# Patient Record
Sex: Male | Born: 2000 | Race: Black or African American | Hispanic: No | Marital: Single | Smoking: Never smoker
Health system: Southern US, Community
[De-identification: ages and names within clinical notes are randomized; demographics above are authoritative.]

## PROBLEM LIST (undated history)

## (undated) HISTORY — PX: WISDOM TOOTH EXTRACTION: SHX21

---
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
## 2022-08-10 IMAGING — CT CT HEAD W/O CM
4 series · 17 of 47 positions shown, 19 images · non-contrast
Comparison: None.

CLINICAL DATA: Fall with head trauma and loss of consciousness

EXAM:
CT HEAD WITHOUT CONTRAST
TECHNIQUE: Contiguous axial images were obtained from the base of the skull
through the vertex without intravenous contrast.

[Series 3: head bone · axial · 0.39mm/px · z∈[-105,-57]mm · 4 of 72 slices shown]
[im 8/72  bone]
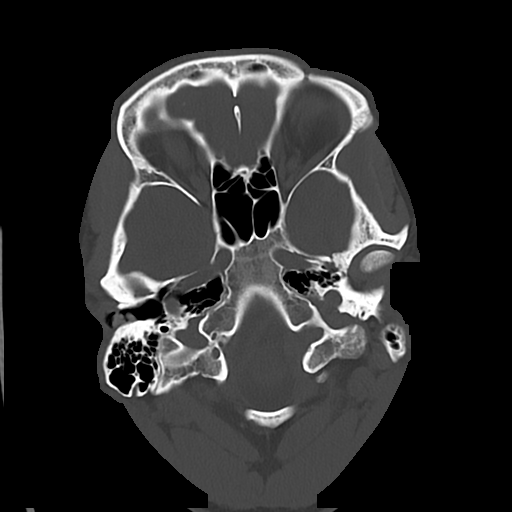
[im 15/72  bone]
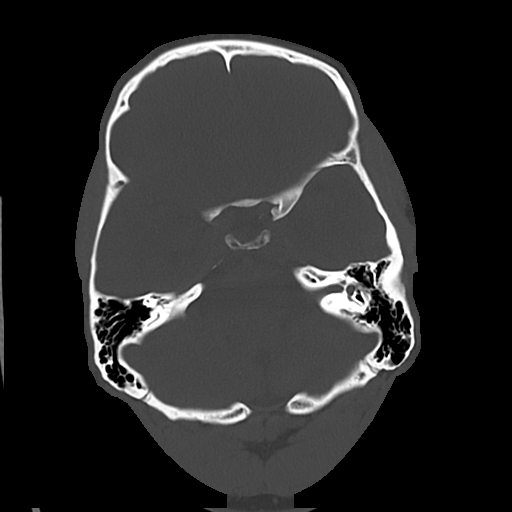
[im 22/72  bone]
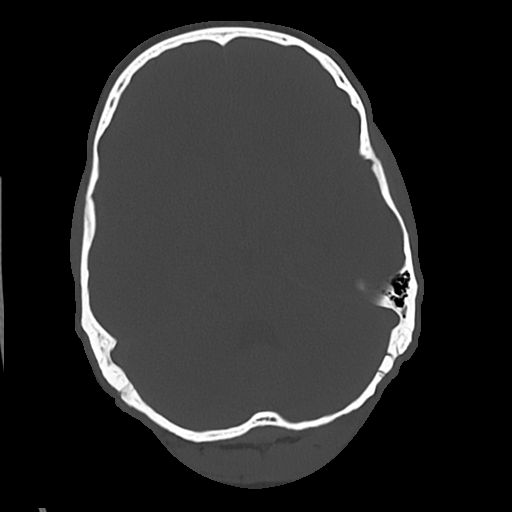
[im 32/72  bone]
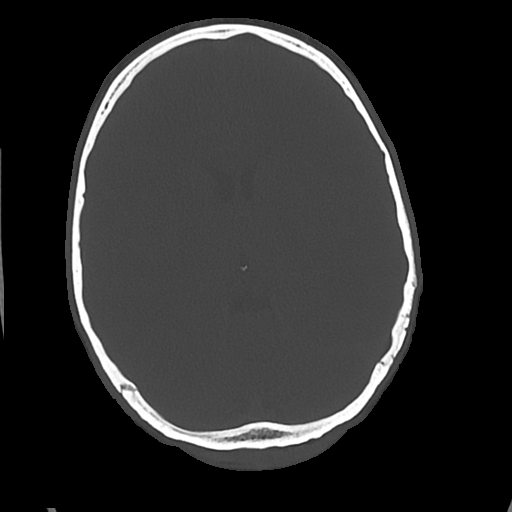

[Series 4: head wo · axial · 0.39mm/px · z∈[-104,+1]mm · 7 of 29 slices shown, 9 images]
[im 4/29  brain]
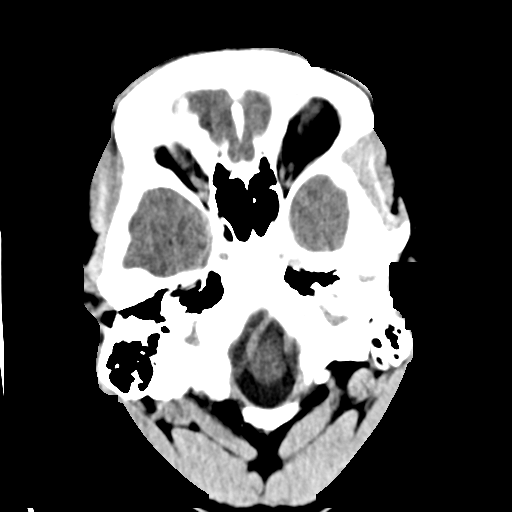
[im 4/29  bone]
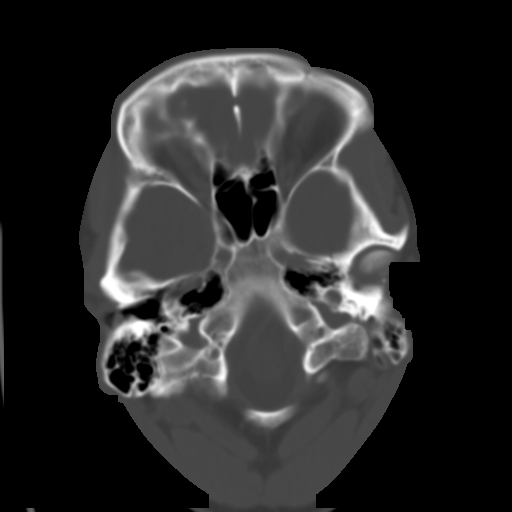
[im 8/29  brain]
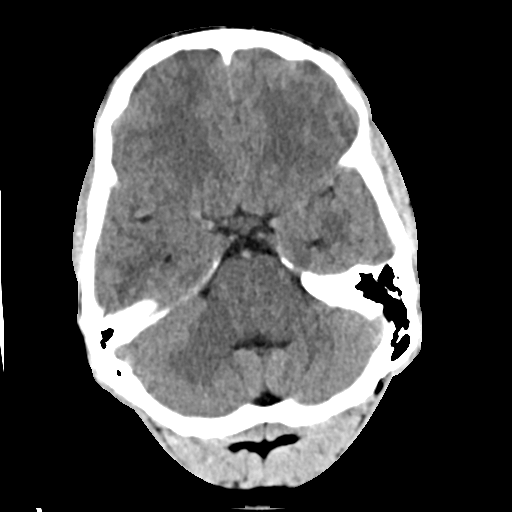
[im 11/29  brain]
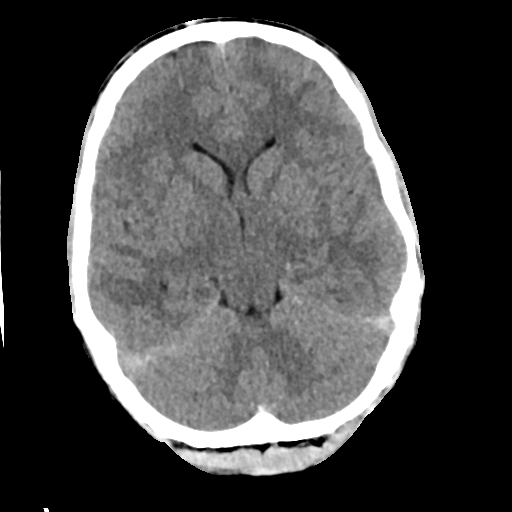
[im 15/29  brain]
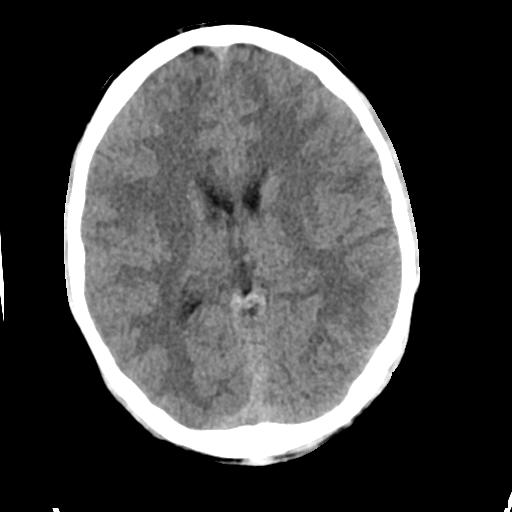
[im 18/29  brain]
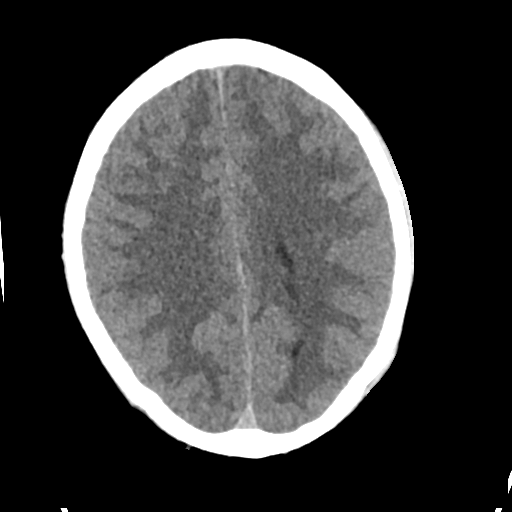
[im 18/29  bone]
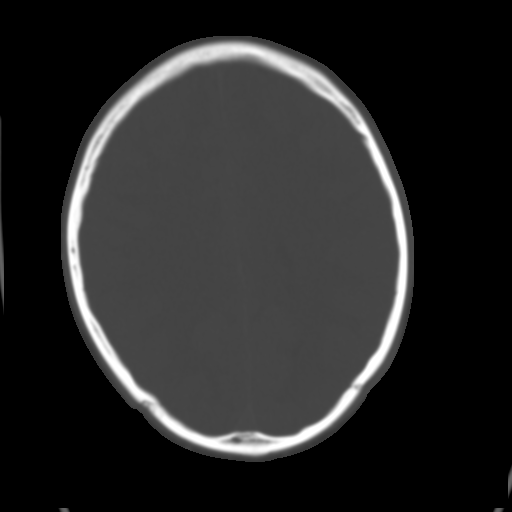
[im 22/29  brain]
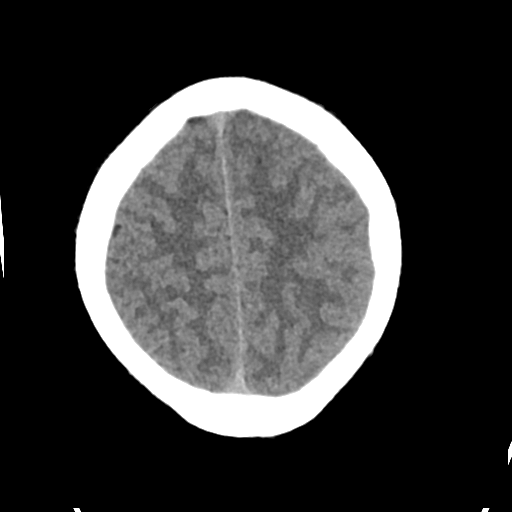
[im 25/29  brain]
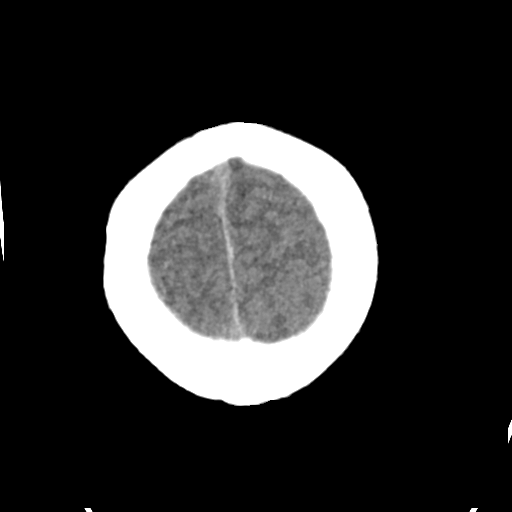

[Series 5: cor soft · coronal · 0.33mm/px · 3 of 64 slices shown]
[im 22/64  brain]
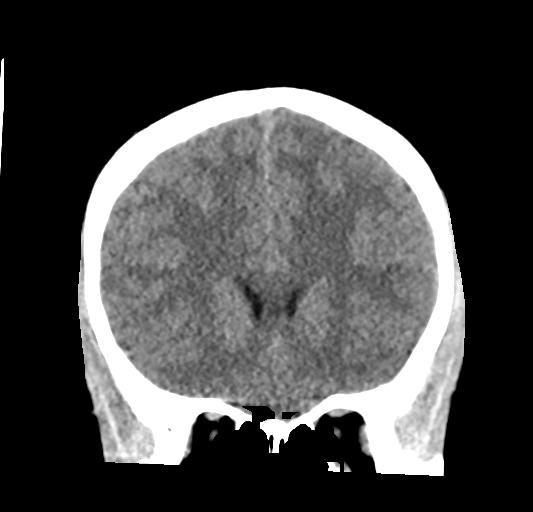
[im 29/64  brain]
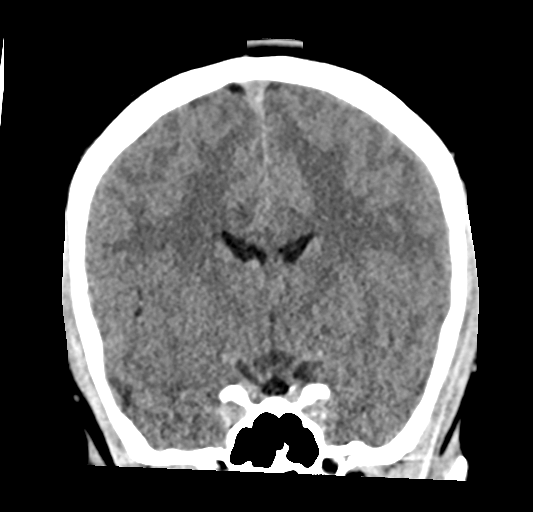
[im 36/64  brain]
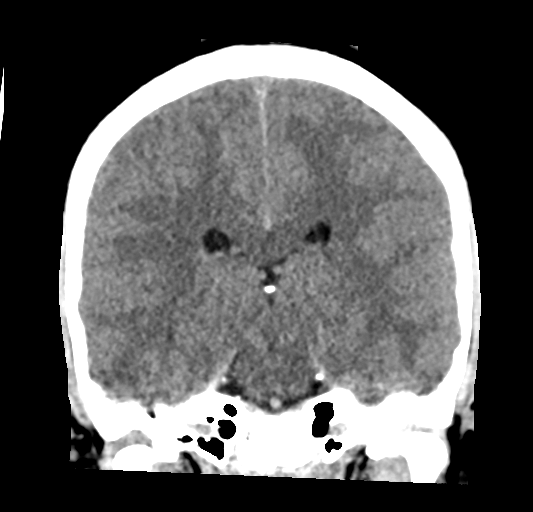

[Series 6: sag soft · sagittal · 0.33mm/px · 3 of 60 slices shown]
[im 20/60  brain]
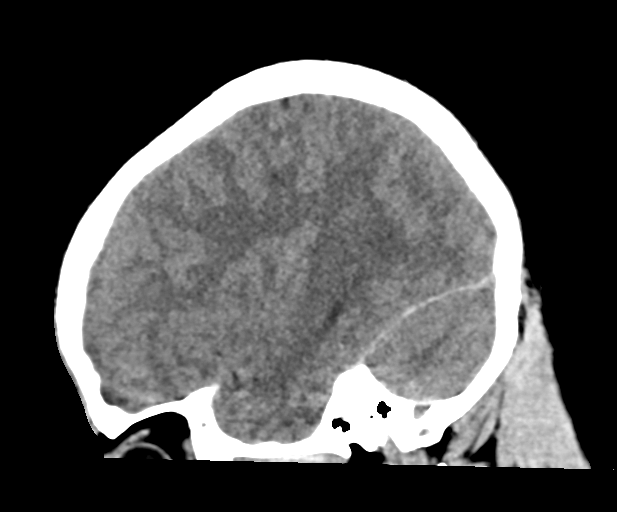
[im 30/60  brain]
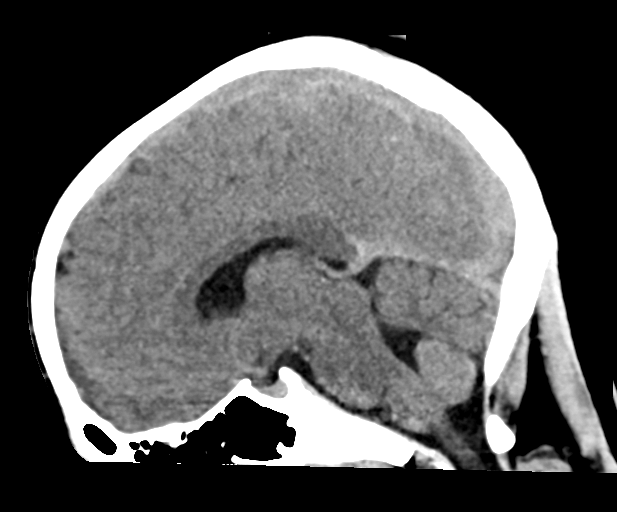
[im 40/60  brain]
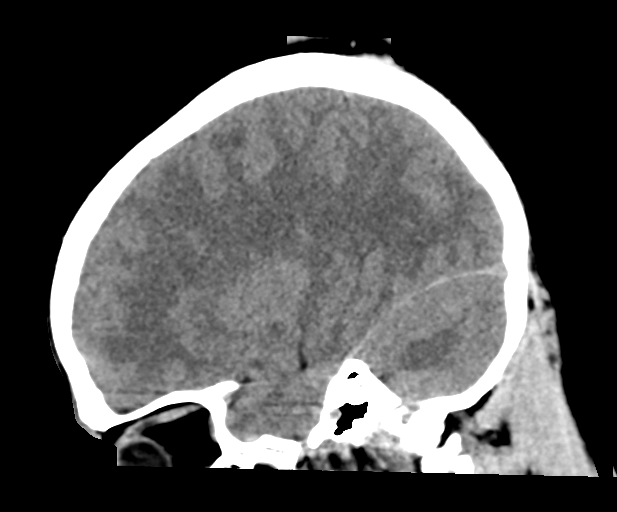

[17 of 47 positions shown; findings below may reference images not displayed]

FINDINGS: Brain: No evidence of acute infarction, hemorrhage, hydrocephalus,
extra-axial collection or mass lesion/mass effect.

Vascular: No hyperdense vessel or unexpected calcification.

Skull: Normal. Negative for fracture or focal lesion.

Sinuses/Orbits: No acute finding.

Other: None.
IMPRESSION: No acute intracranial abnormality.

## 2022-09-19 ENCOUNTER — Inpatient Hospital Stay (HOSPITAL_COMMUNITY)
Admission: EM | Admit: 2022-09-19 | Discharge: 2022-09-21 | DRG: 558 | Disposition: A | Discharge status: Home / Self Care | Payer: Medicaid Other | Attending: Family Medicine | Admitting: Family Medicine

## 2022-09-19 ENCOUNTER — Other Ambulatory Visit: Payer: Self-pay

## 2022-09-19 ENCOUNTER — Encounter (HOSPITAL_COMMUNITY): Payer: Self-pay

## 2022-09-19 DIAGNOSIS — D72829 Elevated white blood cell count, unspecified: Secondary | ICD-10-CM | POA: Insufficient documentation

## 2022-09-19 DIAGNOSIS — R7989 Other specified abnormal findings of blood chemistry: Secondary | ICD-10-CM | POA: Diagnosis not present

## 2022-09-19 DIAGNOSIS — E878 Other disorders of electrolyte and fluid balance, not elsewhere classified: Secondary | ICD-10-CM | POA: Diagnosis present

## 2022-09-19 DIAGNOSIS — E785 Hyperlipidemia, unspecified: Secondary | ICD-10-CM | POA: Diagnosis present

## 2022-09-19 DIAGNOSIS — R7401 Elevation of levels of liver transaminase levels: Secondary | ICD-10-CM | POA: Diagnosis not present

## 2022-09-19 DIAGNOSIS — N179 Acute kidney failure, unspecified: Secondary | ICD-10-CM | POA: Diagnosis present

## 2022-09-19 DIAGNOSIS — M6282 Rhabdomyolysis: Secondary | ICD-10-CM | POA: Diagnosis not present

## 2022-09-19 DIAGNOSIS — E871 Hypo-osmolality and hyponatremia: Secondary | ICD-10-CM | POA: Diagnosis present

## 2022-09-19 LAB — COMPREHENSIVE METABOLIC PANEL
ALT: 23 U/L (ref 0–44)
AST: 43 U/L — ABNORMAL HIGH (ref 15–41)
Albumin: 4.6 g/dL (ref 3.5–5.0)
Alkaline Phosphatase: 51 U/L (ref 38–126)
Anion gap: 15 (ref 5–15)
BUN: 14 mg/dL (ref 6–20)
CO2: 24 mmol/L (ref 22–32)
Calcium: 9.7 mg/dL (ref 8.9–10.3)
Chloride: 95 mmol/L — ABNORMAL LOW (ref 98–111)
Creatinine, Ser: 1.48 mg/dL — ABNORMAL HIGH (ref 0.61–1.24)
GFR, Estimated: >60 mL/min (ref >60)
Glucose, Bld: 88 mg/dL (ref 70–99)
Potassium: 4.2 mmol/L (ref 3.5–5.1)
Sodium: 134 mmol/L — ABNORMAL LOW (ref 135–145)
Total Bilirubin: 1.7 mg/dL — ABNORMAL HIGH (ref 0.3–1.2)
Total Protein: 6.9 g/dL (ref 6.5–8.1)

## 2022-09-19 LAB — CBC WITH DIFFERENTIAL/PLATELET
Abs Immature Granulocytes: 0.05 10*3/uL (ref 0.00–0.07)
Basophils Absolute: 0.1 10*3/uL (ref 0.0–0.1)
Basophils Relative: 0 %
Eosinophils Absolute: 0 10*3/uL (ref 0.0–0.5)
Eosinophils Relative: 0 %
HCT: 42.3 % (ref 39.0–52.0)
Hemoglobin: 15 g/dL (ref 13.0–17.0)
Immature Granulocytes: 0 %
Lymphocytes Relative: 9 %
Lymphs Abs: 1.2 10*3/uL (ref 0.7–4.0)
MCH: 29.8 pg (ref 26.0–34.0)
MCHC: 35.5 g/dL (ref 30.0–36.0)
MCV: 83.9 fL (ref 80.0–100.0)
Monocytes Absolute: 0.6 10*3/uL (ref 0.1–1.0)
Monocytes Relative: 5 %
Neutro Abs: 11.7 10*3/uL — ABNORMAL HIGH (ref 1.7–7.7)
Neutrophils Relative %: 86 %
Platelets: 214 10*3/uL (ref 150–400)
RBC: 5.04 MIL/uL (ref 4.22–5.81)
RDW: 12 % (ref 11.5–15.5)
WBC: 13.7 10*3/uL — ABNORMAL HIGH (ref 4.0–10.5)
nRBC: 0 % (ref 0.0–0.2)

## 2022-09-19 LAB — MAGNESIUM: Magnesium: 2.3 mg/dL (ref 1.7–2.4)

## 2022-09-19 LAB — CK: Total CK: 1213 U/L — ABNORMAL HIGH (ref 49–397)

## 2022-09-19 MED ORDER — ENOXAPARIN SODIUM 40 MG/0.4ML IJ SOSY
40.0000 mg | PREFILLED_SYRINGE | INTRAMUSCULAR | Status: DC
  Administered 2022-09-19 – 2022-09-20 (×2): 40 mg via SUBCUTANEOUS
  Filled 2022-09-19 (×2): qty 0.4

## 2022-09-19 MED ORDER — ACETAMINOPHEN 650 MG RE SUPP: 650.0000 mg | Freq: Four times a day (QID) | RECTAL | Status: DC | PRN

## 2022-09-19 MED ORDER — SODIUM CHLORIDE 0.9% FLUSH
3.0000 mL | Freq: Two times a day (BID) | INTRAVENOUS | Status: DC
  Administered 2022-09-19 – 2022-09-21 (×3): 3 mL via INTRAVENOUS

## 2022-09-19 MED ORDER — POLYETHYLENE GLYCOL 3350 17 G PO PACK: 17.0000 g | PACK | Freq: Every day | ORAL | Status: DC | PRN

## 2022-09-19 MED ORDER — SODIUM CHLORIDE 0.9 % IV BOLUS
1000.0000 mL | Freq: Once | INTRAVENOUS | Status: AC
  Administered 2022-09-19: 1000 mL via INTRAVENOUS

## 2022-09-19 MED ORDER — SODIUM CHLORIDE 0.9 % IV SOLN: INTRAVENOUS | Status: DC

## 2022-09-19 MED ORDER — ACETAMINOPHEN 325 MG PO TABS: 650.0000 mg | ORAL_TABLET | Freq: Four times a day (QID) | ORAL | Status: DC | PRN

## 2022-09-19 NOTE — H&P (Signed)
History and Physical   [name]:[id number] DOB: 2000/07/16 DOA: 09/19/2022  PCP: Ronney Asters, MD   Patient coming from: Home  Chief Complaint: Diffuse cramping/muscle aches  HPI: Jacob Decker is a 22 y.o. male with medical history significant of hyperlipidemia, childhood obesity presenting with diffuse muscle pain/cramping.  Patient reports he had football practice today which included a lot of running but no significant hitting drills.  He states that he felt well during but after he went to the locker room following practice he began to get cramping first in his left leg than his right leg.  This progressed to full body cramping.  Reports a single dark urination since practice.  Denies fevers, chills, chest pain, shortness of breath, abdominal pain, constipation, diarrhea, nausea, vomiting.  ED Course: Vital signs in the ED stable.  Lab workup included CMP with sodium 134, chloride 95, creatinine elevated to 1.48 from unknown baseline, AST 43, T. bili 1.7.  CBC with leukocytosis to 13.7.  CK elevated to 1213.  Magnesium normal.  Patient received 2 L IV fluid in the ED.  Review of Systems: As per HPI otherwise all other systems reviewed and are negative.  Past Medical History:  Diagnosis Date   Childhood obesity, BMI 95-100 percentile 12/20/2019    Past Surgical History:  Procedure Laterality Date   WISDOM TOOTH EXTRACTION      Social History  reports that he has never smoked. He has never used smokeless tobacco. He reports that he does not drink alcohol and does not use drugs.  No Known Allergies  History reviewed. No pertinent family history.  Prior to Admission medications   Not on File    Physical Exam: Vitals:   09/19/22 1522 09/19/22 1543  BP: 139/61   Pulse: 82   Resp: 16   Temp: 98 F (36.7 C)   TempSrc: Oral   SpO2: 100%   Weight:  79.4 kg  Height:  5\' 8"  (1.727 m)    Physical Exam Constitutional:      General: He is not in  acute distress.    Appearance: Normal appearance.  HENT:     Head: Normocephalic and atraumatic.     Mouth/Throat:     Mouth: Mucous membranes are moist.     Pharynx: Oropharynx is clear.  Eyes:     Extraocular Movements: Extraocular movements intact.     Pupils: Pupils are equal, round, and reactive to light.  Cardiovascular:     Rate and Rhythm: Normal rate and regular rhythm.     Pulses: Normal pulses.     Heart sounds: Normal heart sounds.  Pulmonary:     Effort: Pulmonary effort is normal. No respiratory distress.     Breath sounds: Normal breath sounds.  Abdominal:     General: Bowel sounds are normal. There is no distension.     Palpations: Abdomen is soft.     Tenderness: There is no abdominal tenderness.  Musculoskeletal:        General: No swelling or deformity.  Skin:    General: Skin is warm and dry.  Neurological:     General: No focal deficit present.     Mental Status: Mental status is at baseline.    Labs on Admission: I have personally reviewed following labs and imaging studies  CBC: Recent Labs  Lab 09/19/22 1613  WBC 13.7*  NEUTROABS 11.7*  HGB 15.0  HCT 42.3  MCV 83.9  PLT 214    Basic Metabolic Panel:  Recent Labs  Lab 09/19/22 1613  NA 134*  K 4.2  CL 95*  CO2 24  GLUCOSE 88  BUN 14  CREATININE 1.48*  CALCIUM 9.7  MG 2.3    GFR: Estimated Creatinine Clearance: 77 mL/min (A) (by C-G formula based on SCr of 1.48 mg/dL (H)).  Liver Function Tests: Recent Labs  Lab 09/19/22 1613  AST 43*  ALT 23  ALKPHOS 51  BILITOT 1.7*  PROT 6.9  ALBUMIN 4.6    Urine analysis: No results found for: "COLORURINE", "APPEARANCEUR", "LABSPEC", "PHURINE", "GLUCOSEU", "HGBUR", "BILIRUBINUR", "KETONESUR", "PROTEINUR", "UROBILINOGEN", "NITRITE", "LEUKOCYTESUR"  Radiological Exams on Admission: No results found.  EKG: Independently reviewed.  Not performed in the emergency department.  Assessment/Plan Active Problems:   Leukocytosis    Transaminitis   Elevated serum creatinine   Rhabdomyolysis   Rhabdomyolysis > Diffuse muscle cramping/aching after football practice today with minimal urination following. > Elevated CK at 1213.  Suspect this will likely arrive significantly before coming back down. > Received 2 L IV fluids in the ED. - Monitor on MedSurg unit - Continue with IV fluid, at least 200 cc an hour - Continue to trend CK - Follow-up urinalysis - Strict I's and O's  Leukocytosis > WBC 13.7 in ED, presumed reactive in the setting of above.  Transaminitis > Mild LFT elevations with AST 43 and T. bili 1.7 likely reactive in the setting of above.  Creatinine elevation > Creatinine elevated to 1.48 from unknown baseline.  Likely component of AKI in the setting of rhabdomyolysis however there could be component secondary to increased muscle mass. - Monitor response to IV fluids - Continue to trend renal function and electrolytes  DVT prophylaxis: Lovenox Code Status:   Full Family Communication:  None on admission  Disposition Plan:   Patient is from:  Home  Anticipated DC to:  Home  Anticipated DC date:  1 to 2 days  Anticipated DC barriers: None  Consults called:  None Admission status:  Observation, MedSurg  Severity of Illness: The appropriate patient status for this patient is OBSERVATION. Observation status is judged to be reasonable and necessary in order to provide the required intensity of service to ensure the patient's safety. The patient's presenting symptoms, physical exam findings, and initial radiographic and laboratory data in the context of their medical condition is felt to place them at decreased risk for further clinical deterioration. Furthermore, it is anticipated that the patient will be medically stable for discharge from the hospital within 2 midnights of admission.    Synetta Fail MD [facility]  How to contact the [facility] Attending or Consulting provider 7A - 7P or  covering provider during after hours 7P -7A, for this patient?   Check the care team in [facility] and look for a) attending/consulting [facility] provider listed and b) the [facility] team listed Log into [email or web address] and use [place]'s universal password to access. If you do not have the password, please contact the hospital operator. Locate the [facility] provider you are looking for under [facility] and page to a number that you can be directly reached. If you still have difficulty reaching the provider, please page the [facility] (Director on Call) for the Hospitalists listed on amion for assistance.  09/19/2022, 6:34 PM

## 2022-09-19 NOTE — ED Provider Notes (Signed)
Jacob Decker Provider Note   CSN: [id number] Arrival date & time: 09/19/22  1542     History Chief Complaint  Patient presents with   Muscle Pain    Jacob Decker is a 22 y.o. male patient presents to the ED with complaints of full body muscle cramps after football practice today. Patient states that the a lot of running but he felt overall well during training. When he got back to the locker room his left leg started cramping and then his right leg started cramping and then progressed to full body cramps. Patient has urinated once since practice and it was dark yellow. No chest pain or shortness of breath.    Muscle Pain       Home Medications Prior to Admission medications   Not on File      Allergies    Patient has no known allergies.    Review of Systems   Review of Systems  All other systems reviewed and are negative.   Physical Exam Updated Vital Signs BP 139/61 (BP Location: Left Arm)   Pulse 82   Temp 98 F (36.7 C) (Oral)   Resp 16   Ht 5\' 8"  (1.727 m)   Wt 79.4 kg   SpO2 100%   BMI 26.61 kg/m  Physical Exam Vitals and nursing note reviewed.  Constitutional:      General: He is not in acute distress.    Appearance: Normal appearance.  HENT:     Head: Normocephalic and atraumatic.  Eyes:     General:        Right eye: No discharge.        Left eye: No discharge.  Cardiovascular:     Comments: Regular rate and rhythm.  S1/S2 are distinct without any evidence of murmur, rubs, or gallops.  Radial pulses are 2+ bilaterally.  Dorsalis pedis pulses are 2+ bilaterally.  No evidence of pedal edema. Pulmonary:     Comments: Clear to auscultation bilaterally.  Normal effort.  No respiratory distress.  No evidence of wheezes, rales, or rhonchi heard throughout. Abdominal:     General: Abdomen is flat. Bowel sounds are normal. There is no distension.     Tenderness: There is no abdominal tenderness. There is  no guarding or rebound.  Musculoskeletal:        General: Normal range of motion.     Cervical back: Neck supple.  Skin:    General: Skin is warm and dry.     Findings: No rash.  Neurological:     General: No focal deficit present.     Mental Status: He is alert.  Psychiatric:        Mood and Affect: Mood normal.        Behavior: Behavior normal.     ED Results / Procedures / Treatments   Labs (all labs ordered are listed, but only abnormal results are displayed) Labs Reviewed  CBC WITH DIFFERENTIAL/PLATELET - Abnormal; Notable for the following components:      Result Value   WBC 13.7 (*)    Neutro Abs 11.7 (*)    All other components within normal limits  COMPREHENSIVE METABOLIC PANEL - Abnormal; Notable for the following components:   Sodium 134 (*)    Chloride 95 (*)    Creatinine, Ser 1.48 (*)    AST 43 (*)    Total Bilirubin 1.7 (*)    All other components within normal limits  CK -  Abnormal; Notable for the following components:   Total CK 1,213 (*)    All other components within normal limits  MAGNESIUM    EKG None  Radiology No results found.  Procedures Procedures    Medications Ordered in ED Medications  sodium chloride 0.9 % bolus 1,000 mL (1,000 mLs Intravenous New Bag/Given 09/19/22 1615)    ED Course/ Medical Decision Making/ A&P Clinical Course as of 09/19/22 1839  Thu Sep 19, 2022  1746 CK(!) 4 times upper limit indicative of rhabdomyolysis. [CF]  1746 CBC with Differential(!) There is evidence of leukocytosis.  [CF]  1748 Comprehensive metabolic panel(!) Elevation in creatinine which is slightly above baseline.  He also evidence of hypochloremia and hyponatremia. [CF]  1839 I spoke with Dr. Alinda Money with [facility] who agrees to admit the patient. [CF]  1839 Magnesium Normal. [CF]    Clinical Course User Index [CF] Teressa Lower, PA-C   {   Click here for ABCD2, HEART and other calculators  Medical Decision  Making Jacob Decker is a 22 y.o. male patient who presents to the emergency department today for further evaluation of full body muscle cramps after practice.  Certainly suspicious for rhabdomyolysis.  Will plan to get a CK level and basic labs to evaluate hydration status.  Will give him a liter of fluid while he is here in the meantime.  Vital signs are normal.  Will need to admit the patient for rhabdomyolysis for aggressive fluid hydration.  I spoke with [facility] hospital[facility] who agrees to admit the patient.  Patient amenable with this plan.  All questions and concerns addressed.  Amount and/or Complexity of Data Reviewed Labs: ordered. Decision-making details documented in ED Course.  Risk Decision regarding hospitalization.    Final Clinical Impression(s) / ED Diagnoses Final diagnoses:  Non-traumatic rhabdomyolysis    Rx / DC Orders ED Discharge Orders     None         Jolyn Lent 09/19/22 1840    Lonell Grandchild, MD 09/19/22 2324

## 2022-09-19 NOTE — ED Triage Notes (Signed)
PER EMS: pt arrives from a 3 hr long football practice with c/o muscle cramps to his chest, legs and arms that started about 3 hours ago and has not went away despite ice packs, water hydration and eating today. He reports he has not urinated in over 3 hours.  BP- 140/80, HR-80, 99% RA, CBG-81

## 2022-09-19 NOTE — Plan of Care (Signed)
  Problem: Education: Goal: Knowledge of General Education information will improve Description: Including pain rating scale, medication(s)/side effects and non-pharmacologic comfort measures Outcome: Progressing   Problem: Activity: Goal: Risk for activity intolerance will decrease Outcome: Progressing   Problem: Coping: Goal: Level of anxiety will decrease Outcome: Progressing   

## 2022-09-19 NOTE — ED Provider Triage Note (Signed)
Emergency Medicine Provider Triage Evaluation Note  Jacob Decker , a 22 y.o. male  was evaluated in triage.  Pt complains of full body muscle cramps after football practice today.  Patient states that the a lot of running but he felt overall well during training.  When he got back to the locker room his left leg started cramping and then his right leg started cramping and then progressed to full body cramps.  Patient has urinated once since practice and it was dark yellow.  No chest pain or shortness of breath.  Review of Systems  Positive:  Negative: See above   Physical Exam  BP 139/61 (BP Location: Left Arm)   Pulse 82   Temp 98 F (36.7 C) (Oral)   Resp 16   Ht 5\' 8"  (1.727 m)   Wt 79.4 kg   SpO2 100%   BMI 26.61 kg/m  Gen:   Awake, no distress   Resp:  Normal effort  MSK:   Moves extremities without difficulty  Other:  Not actively spasming  Medical Decision Making  Medically screening exam initiated at 4:07 PM.  Appropriate orders placed.  Jacob Decker was informed that the remainder of the evaluation will be completed by another provider, this initial triage assessment does not replace that evaluation, and the importance of remaining in the ED until their evaluation is complete.  Will add on a CK level to look for possible rhabdomyolysis.  Will also look for electrolyte derangements.  Fluids ordered.   Honor Loh [place], [place] 09/19/22 [phone]

## 2022-09-19 NOTE — ED Notes (Signed)
ED TO INPATIENT HANDOFF REPORT  ED Nurse Name and Phone #: Raquel Sarna  S Name/Age/Gender Jacob Decker 22 y.o. male Room/Bed: 002C/002C  Code Status   Code Status: Full Code  Home/SNF/Other Home Patient oriented to: self, place, time, and situation Is this baseline? Yes   Triage Complete: Triage complete  Chief Complaint Rhabdomyolysis [M62.82]  Triage Note PER EMS: pt arrives from a 3 hr long football practice with c/o muscle cramps to his chest, legs and arms that started about 3 hours ago and has not went away despite ice packs, water hydration and eating today. He reports he has not urinated in over 3 hours.  BP- 140/80, HR-80, 99% RA, CBG-81   Allergies No Known Allergies  Level of Care/Admitting Diagnosis ED Disposition     ED Disposition  Admit   Condition  --   Comment  Hospital Area: [facility] [[id number]]  Level of Care: Med-Surg [16]  May place patient in observation at [facility] or [facility] if equivalent level of care is available:: No  Covid Evaluation: Asymptomatic - no recent exposure (last 10 days) testing not required  Diagnosis: Rhabdomyolysis [728.88.ICD-9-CM]  Admitting Physician: Synetta Fail [[id number]]  Attending Physician: Synetta Fail [[id number]]          B Medical/Surgery History Past Medical History:  Diagnosis Date   Childhood obesity, BMI 95-100 percentile 12/20/2019   Past Surgical History:  Procedure Laterality Date   WISDOM TOOTH EXTRACTION       A IV Location/Drains/Wounds Patient Lines/Drains/Airways Status     Active Line/Drains/Airways     Name Placement date Placement time Site Days   Peripheral IV 09/19/22 20 G Right Antecubital 09/19/22  1612  Antecubital  less than 1            Intake/Output Last 24 hours  Intake/Output Summary (Last 24 hours) at 09/19/2022 1917 Last data filed at 09/19/2022 1840 Gross per 24 hour  Intake 1000 ml  Output --  Net 1000 ml     Labs/Imaging Results for orders placed or performed during the hospital encounter of 09/19/22 (from the past 48 hour(s))  CBC with Differential     Status: Abnormal   Collection Time: 09/19/22  4:13 PM  Result Value Ref Range   WBC 13.7 (H) 4.0 - 10.5 K/uL   RBC 5.04 4.22 - 5.81 MIL/uL   Hemoglobin 15.0 13.0 - 17.0 g/dL   HCT 47.4 25.9 - 56.3 %   MCV 83.9 80.0 - 100.0 fL   MCH 29.8 26.0 - 34.0 pg   MCHC 35.5 30.0 - 36.0 g/dL   RDW 87.5 64.3 - 32.9 %   Platelets 214 150 - 400 K/uL   nRBC 0.0 0.0 - 0.2 %   Neutrophils Relative % 86 %   Neutro Abs 11.7 (H) 1.7 - 7.7 K/uL   Lymphocytes Relative 9 %   Lymphs Abs 1.2 0.7 - 4.0 K/uL   Monocytes Relative 5 %   Monocytes Absolute 0.6 0.1 - 1.0 K/uL   Eosinophils Relative 0 %   Eosinophils Absolute 0.0 0.0 - 0.5 K/uL   Basophils Relative 0 %   Basophils Absolute 0.1 0.0 - 0.1 K/uL   Immature Granulocytes 0 %   Abs Immature Granulocytes 0.05 0.00 - 0.07 K/uL    Comment: Performed at [facility], [address], [place], [place] [address]  Comprehensive metabolic panel     Status: Abnormal   Collection Time: 09/19/22  4:13  PM  Result Value Ref Range   Sodium 134 (L) 135 - 145 mmol/L   Potassium 4.2 3.5 - 5.1 mmol/L   Chloride 95 (L) 98 - 111 mmol/L   CO2 24 22 - 32 mmol/L   Glucose, Bld 88 70 - 99 mg/dL    Comment: Glucose reference range applies only to samples taken after fasting for at least 8 hours.   BUN 14 6 - 20 mg/dL   Creatinine, Ser 8.41 (H) 0.61 - 1.24 mg/dL   Calcium 9.7 8.9 - 32.4 mg/dL   Total Protein 6.9 6.5 - 8.1 g/dL   Albumin 4.6 3.5 - 5.0 g/dL   AST 43 (H) 15 - 41 U/L   ALT 23 0 - 44 U/L   Alkaline Phosphatase 51 38 - 126 U/L   Total Bilirubin 1.7 (H) 0.3 - 1.2 mg/dL   GFR, Estimated >40 >10 mL/min    Comment: (NOTE) Calculated using the CKD-EPI Creatinine Equation (2021)    Anion gap 15 5 - 15    Comment: Performed at [facility], [address], [place], [place] [address]  CK      Status: Abnormal   Collection Time: 09/19/22  4:13 PM  Result Value Ref Range   Total CK 1,213 (H) 49 - 397 U/L    Comment: Performed at [facility], [address], [place], [place] [address]  Magnesium     Status: None   Collection Time: 09/19/22  4:13 PM  Result Value Ref Range   Magnesium 2.3 1.7 - 2.4 mg/dL    Comment: Performed at [facility], [address], [place], [place] [address]   No results found.  Pending Labs Unresulted Labs (From admission, onward)     Start     Ordered   09/26/22 0500  Creatinine, serum  (enoxaparin (LOVENOX)    CrCl >/= 30 ml/min)  Weekly,   R     Comments: while on enoxaparin therapy    09/19/22 1834   09/20/22 0500  Comprehensive metabolic panel  Tomorrow morning,   R        09/19/22 1834   09/20/22 0500  CBC  Tomorrow morning,   R        09/19/22 1834   09/19/22 2200  CK  Daily,   R      09/19/22 1834   09/19/22 1828  HIV Antibody (routine testing w rflx)  (HIV Antibody (Routine testing w reflex) panel)  Once,   R        09/19/22 1834   09/19/22 1746  Urinalysis, Routine w reflex microscopic -Urine, Clean Catch  Once,   URGENT       Question:  Specimen Source  Answer:  Urine, Clean Catch   09/19/22 1745            Vitals/Pain Today's Vitals   09/19/22 1522 09/19/22 1543 09/19/22 1916  BP: 139/61  135/65  Pulse: 82  79  Resp: 16  18  Temp: 98 F (36.7 C)  98 F (36.7 C)  TempSrc: Oral  Oral  SpO2: 100%  100%  Weight:  79.4 kg   Height:  5\' 8"  (1.727 m)   PainSc:  7  0-No pain    Isolation Precautions No active isolations  Medications Medications  enoxaparin (LOVENOX) injection 40 mg (has no administration in time range)  sodium chloride flush (NS) 0.9 % injection 3 mL (has no administration in time range)  0.9 %  sodium chloride infusion ( Intravenous New Bag/Given 09/19/22 1902)  acetaminophen (TYLENOL) tablet 650 mg (has no administration in time range)    Or  acetaminophen (TYLENOL) suppository 650  mg (has no administration in time range)  polyethylene glycol (MIRALAX / GLYCOLAX) packet 17 g (has no administration in time range)  sodium chloride 0.9 % bolus 1,000 mL (0 mLs Intravenous Stopped 09/19/22 1840)  sodium chloride 0.9 % bolus 1,000 mL (1,000 mLs Intravenous New Bag/Given 09/19/22 1902)    Mobility walks     Focused Assessments Cardiac Assessment Handoff:    Lab Results  Component Value Date   CKTOTAL 1,213 (H) 09/19/2022   No results found for: "DDIMER" Does the Patient currently have chest pain? No    R Recommendations: See Admitting Provider Note  Report given to:   Additional Notes: Pt a/o x 4, ambulatory with no assistance

## 2022-09-20 DIAGNOSIS — R7401 Elevation of levels of liver transaminase levels: Secondary | ICD-10-CM | POA: Diagnosis present

## 2022-09-20 DIAGNOSIS — N179 Acute kidney failure, unspecified: Secondary | ICD-10-CM | POA: Diagnosis present

## 2022-09-20 DIAGNOSIS — R7989 Other specified abnormal findings of blood chemistry: Secondary | ICD-10-CM | POA: Diagnosis present

## 2022-09-20 DIAGNOSIS — E878 Other disorders of electrolyte and fluid balance, not elsewhere classified: Secondary | ICD-10-CM | POA: Diagnosis present

## 2022-09-20 DIAGNOSIS — E871 Hypo-osmolality and hyponatremia: Secondary | ICD-10-CM | POA: Diagnosis present

## 2022-09-20 DIAGNOSIS — M6282 Rhabdomyolysis: Secondary | ICD-10-CM | POA: Diagnosis not present

## 2022-09-20 DIAGNOSIS — E785 Hyperlipidemia, unspecified: Secondary | ICD-10-CM | POA: Diagnosis present

## 2022-09-20 DIAGNOSIS — D72829 Elevated white blood cell count, unspecified: Secondary | ICD-10-CM | POA: Diagnosis present

## 2022-09-20 LAB — HIV ANTIBODY (ROUTINE TESTING W REFLEX): HIV Screen 4th Generation wRfx: NONREACTIVE

## 2022-09-20 LAB — CBC
HCT: 36.7 % — ABNORMAL LOW (ref 39.0–52.0)
Hemoglobin: 12.6 g/dL — ABNORMAL LOW (ref 13.0–17.0)
MCH: 28.4 pg (ref 26.0–34.0)
MCHC: 34.3 g/dL (ref 30.0–36.0)
MCV: 82.7 fL (ref 80.0–100.0)
Platelets: 196 10*3/uL (ref 150–400)
RBC: 4.44 MIL/uL (ref 4.22–5.81)
RDW: 12.2 % (ref 11.5–15.5)
WBC: 9.2 10*3/uL (ref 4.0–10.5)
nRBC: 0 % (ref 0.0–0.2)

## 2022-09-20 LAB — COMPREHENSIVE METABOLIC PANEL
ALT: 23 U/L (ref 0–44)
AST: 62 U/L — ABNORMAL HIGH (ref 15–41)
Albumin: 3.5 g/dL (ref 3.5–5.0)
Alkaline Phosphatase: 44 U/L (ref 38–126)
Anion gap: 8 (ref 5–15)
BUN: 12 mg/dL (ref 6–20)
CO2: 24 mmol/L (ref 22–32)
Calcium: 8.6 mg/dL — ABNORMAL LOW (ref 8.9–10.3)
Chloride: 105 mmol/L (ref 98–111)
Creatinine, Ser: 1.29 mg/dL — ABNORMAL HIGH (ref 0.61–1.24)
GFR, Estimated: >60 mL/min (ref >60)
Glucose, Bld: 97 mg/dL (ref 70–99)
Potassium: 3.7 mmol/L (ref 3.5–5.1)
Sodium: 137 mmol/L (ref 135–145)
Total Bilirubin: 1.6 mg/dL — ABNORMAL HIGH (ref 0.3–1.2)
Total Protein: 5.7 g/dL — ABNORMAL LOW (ref 6.5–8.1)

## 2022-09-20 LAB — CK: Total CK: 3583 U/L — ABNORMAL HIGH (ref 49–397)

## 2022-09-20 NOTE — Progress Notes (Signed)
[facility]  PROGRESS NOTE  Jacob Decker ION:[id number] DOB: 2000-08-29 DOA: 09/19/2022 PCP: Ronney Asters, MD   Brief HPI:   22 year old male with medical history of hyperlipidemia, childhood obesity presented with diffuse muscle pain/cramping.  Patient says that he had football practice, included a lot of running and significant hitting drills.  Patient started having cramping in his legs after the practice session.  Cramping moved over to involve whole body. Came to ED CK was elevated at 1213.  Started on IV fluids    Assessment/Plan:    Rhabdomyolysis -Initial CK was 1213 -Repeat CK this morning is up to 3583 -Continue normal saline at 200 mL/h -Follow CK level in a.m.  Transaminitis -Mild elevation of AST and total bili -Likely in setting of above -Follow LFTs in a.m.  Acute kidney injury -Presented with creatinine of 1.48 -Creatinine improved to 1.29 -Continue hydration as above -Follow renal function in a.m.   Medications     enoxaparin (LOVENOX) injection  40 mg Subcutaneous Q24H   sodium chloride flush  3 mL Intravenous Q12H     Data Reviewed:   CBG:  No results for input(s): "GLUCAP" in the last 168 hours.  SpO2: 100 %    Vitals:   09/19/22 2338 09/20/22 0419 09/20/22 0500 09/20/22 0748  BP: (!) 126/49 131/68  133/66  Pulse: 76 (!) 55  75  Resp: 18 18  16   Temp: 98 F (36.7 C)   98.2 F (36.8 C)  TempSrc: Oral   Oral  SpO2: 100% 99%  100%  Weight:   91.1 kg   Height:          Data Reviewed:  Basic Metabolic Panel: Recent Labs  Lab 09/19/22 1613 09/20/22 0002  NA 134* 137  K 4.2 3.7  CL 95* 105  CO2 24 24  GLUCOSE 88 97  BUN 14 12  CREATININE 1.48* 1.29*  CALCIUM 9.7 8.6*  MG 2.3  --     CBC: Recent Labs  Lab 09/19/22 1613 09/20/22 0002  WBC 13.7* 9.2  NEUTROABS 11.7*  --   HGB 15.0 12.6*  HCT 42.3 36.7*  MCV 83.9 82.7  PLT 214 196    LFT Recent Labs  Lab 09/19/22 1613 09/20/22 0002  AST 43* 62*   ALT 23 23  ALKPHOS 51 44  BILITOT 1.7* 1.6*  PROT 6.9 5.7*  ALBUMIN 4.6 3.5     Antibiotics: Anti-infectives (From admission, onward)    None        DVT prophylaxis: Lovenox  Code Status: Full code  Family Communication: No family at bedside   CONSULTS    Subjective   Patient seen and examined, denies pain.   Objective    Physical Examination:  General-appears in no acute distress Heart-S1-S2, regular, no murmur auscultated Lungs-clear to auscultation bilaterally, no wheezing or crackles auscultated Abdomen-soft, nontender, no organomegaly Extremities-no edema in the lower extremities Neuro-alert, oriented x3, no focal deficit noted   Status is: Inpatient:         Meredeth Ide   [facility] If 7PM-7AM, please contact night-coverage at [email or web address], Office  [phone]   09/20/2022, 10:01 AM  LOS: 0 days

## 2022-09-20 NOTE — Plan of Care (Signed)
  Problem: Activity: Goal: Risk for activity intolerance will decrease Outcome: Progressing   

## 2022-09-20 NOTE — Plan of Care (Signed)
  Problem: Education: Goal: Knowledge of General Education information will improve Description: Including pain rating scale, medication(s)/side effects and non-pharmacologic comfort measures Outcome: Progressing Problem: Education: Goal: Knowledge of General Education information will improve Description: Including pain rating scale, medication(s)/side effects and non-pharmacologic comfort measures Outcome: Progressing Pt understands he was admitted into the hospital for diffused muscle cramping and aches r/t rhabdomyolysis.    Problem: Activity: Goal: Risk for activity intolerance will decrease Outcome: Progressing Pt is independent of all his ADLs and can ambulate in his room with a steady gait.   Problem: Coping: Goal: Level of anxiety will decrease Outcome: Progressing Pt is anxious to go home but understands his lab results need to improve prior to.

## 2022-09-21 DIAGNOSIS — R7989 Other specified abnormal findings of blood chemistry: Secondary | ICD-10-CM | POA: Diagnosis not present

## 2022-09-21 DIAGNOSIS — R7401 Elevation of levels of liver transaminase levels: Secondary | ICD-10-CM | POA: Diagnosis not present

## 2022-09-21 DIAGNOSIS — M6282 Rhabdomyolysis: Secondary | ICD-10-CM | POA: Diagnosis not present

## 2022-09-21 LAB — COMPREHENSIVE METABOLIC PANEL
ALT: 33 U/L (ref 0–44)
AST: 73 U/L — ABNORMAL HIGH (ref 15–41)
Albumin: 3.5 g/dL (ref 3.5–5.0)
Alkaline Phosphatase: 47 U/L (ref 38–126)
Anion gap: 7 (ref 5–15)
BUN: 11 mg/dL (ref 6–20)
CO2: 24 mmol/L (ref 22–32)
Calcium: 8.7 mg/dL — ABNORMAL LOW (ref 8.9–10.3)
Chloride: 107 mmol/L (ref 98–111)
Creatinine, Ser: 1.2 mg/dL (ref 0.61–1.24)
GFR, Estimated: >60 mL/min (ref >60)
Glucose, Bld: 93 mg/dL (ref 70–99)
Potassium: 3.9 mmol/L (ref 3.5–5.1)
Sodium: 138 mmol/L (ref 135–145)
Total Bilirubin: 1 mg/dL (ref 0.3–1.2)
Total Protein: 5.6 g/dL — ABNORMAL LOW (ref 6.5–8.1)

## 2022-09-21 LAB — CK: Total CK: 3000 U/L — ABNORMAL HIGH (ref 49–397)

## 2022-09-21 NOTE — Plan of Care (Signed)
Problem: Education: Goal: Knowledge of General Education information will improve Description: Including pain rating scale, medication(s)/side effects and non-pharmacologic comfort measures Outcome: Progressing Problem: Education: Goal: Knowledge of General Education information will improve Description: Including pain rating scale, medication(s)/side effects and non-pharmacologic comfort measures Outcome: Progressing Pt understands he was admitted into the hospital for diffused muscle cramping and aches r/t rhabdomyolysis.  He is currently awaiting d/c home to self care.       Problem: Activity: Goal: Risk for activity intolerance will decrease Outcome: Progressing Pt is independent of all his ADLs and can ambulate in his room with a steady gait.    Problem: Coping: Goal: Level of anxiety will decrease Outcome: Progressing Pt is anxious to go home but understands his lab results need to improve prior to.  He is currently awaiting d/c home to self care.

## 2022-09-21 NOTE — Discharge Summary (Signed)
Physician Discharge Summary   Patient: Jacob Decker MRN: [id number] DOB: [birth year]  Admit date:     09/19/2022  Discharge date: 09/21/22  Discharge Physician: Meredeth Ide   PCP: Ronney Asters, MD   Recommendations at discharge:    Follow up pcp on 8/20 Check CK on 8/20 Drink 2 quarts of fluid everyday  Discharge Diagnoses: Principal Problem:   Rhabdomyolysis Active Problems:   Leukocytosis   Transaminitis   Elevated serum creatinine  Resolved Problems:   * No resolved hospital problems. *  Hospital Course: 22 year old male with medical history of hyperlipidemia, childhood obesity presented with diffuse muscle pain/cramping.  Patient says that he had football practice, included a lot of running and significant hitting drills.  Patient started having cramping in his legs after the practice session.  Cramping moved over to involve whole body. Came to ED CK was elevated at 1213.  Started on IV fluids  Assessment and Plan:  Rhabdomyolysis -Initial CK was 1213 -Repeat CK  went up to 3583 -CK is now down to 3000 - Will d/c home today; discussed with Dr Arlean Hopping, nephrology -check CK as outpatient    Transaminitis -Mild elevation of AST and total bili -Likely in setting of above - improving    Acute kidney injury -resolved -Presented with creatinine of 1.48 -Creatinine improved to 1.20 with IV hydration        Consultants:  Procedures performed:  Disposition: Home Diet recommendation:  Discharge Diet Orders (From admission, onward)     Start     Ordered   09/21/22 0000  Diet - low sodium heart healthy        09/21/22 0906           Regular diet DISCHARGE MEDICATION: Allergies as of 09/21/2022   No Known Allergies      Medication List     TAKE these medications    ASHWAGANDHA PO Take 1 tablet by mouth daily.   BEET ROOT PO Take 1 tablet by mouth daily.   MISC NATURAL PRODUCTS PO Take 1 capsule by mouth every evening. Seamoss    MACA ROOT PO Take 1 tablet by mouth every evening.        Follow-up Information     [facility] Follow up on 09/24/2022.   Why: Post hospital follow up and to establish primaryscheduled for 09/24/2022  at 9:50 am with Bertram Denver NP  Check CK on 8/20 Contact information: [address] [place] [address] [phone]               Discharge Exam: Ceasar Mons Weights   09/19/22 1543 09/20/22 0500  Weight: 79.4 kg 91.1 kg   Appear in  no acute distress S1s2 RRR Lungs CTA  Condition at discharge: good  The results of significant diagnostics from this hospitalization (including imaging, microbiology, ancillary and laboratory) are listed below for reference.   Imaging Studies: No results found.  Microbiology: Results for orders placed or performed in visit on 01/12/19  Novel Coronavirus, NAA ([facility])     Status: None   Collection Time: 01/12/19 11:16 AM   Specimen: Nasopharyngeal(NP) swabs in vial transport medium   NASOPHARYNGE  TESTING  Result Value Ref Range Status   SARS-CoV-2, NAA Not Detected Not Detected Final    Comment: This nucleic acid amplification test was developed and its performance characteristics determined by [employer]. Nucleic acid amplification tests include PCR and TMA. This test has not been FDA  cleared or approved. This test has been authorized by FDA under an Emergency Use Authorization (EUA). This test is only authorized for the duration of time the declaration that circumstances exist justifying the authorization of the emergency use of in vitro diagnostic tests for detection of SARS-CoV-2 virus and/or diagnosis of COVID-19 infection under section 564(b)(1) of the Act, 21 U.S.C. 696EXB-2(W) (1), unless the authorization is terminated or revoked sooner. When diagnostic testing is negative, the possibility of a false negative result should be considered in the context  of a patient's recent exposures and the presence of clinical signs and symptoms consistent with COVID-19. An individual without symptoms of COVID-19 and who is not shedding SARS-CoV-2 virus would  expect to have a negative (not detected) result in this assay.     Labs: CBC: Recent Labs  Lab 09/19/22 1613 09/20/22 0002  WBC 13.7* 9.2  NEUTROABS 11.7*  --   HGB 15.0 12.6*  HCT 42.3 36.7*  MCV 83.9 82.7  PLT 214 196   Basic Metabolic Panel: Recent Labs  Lab 09/19/22 1613 09/20/22 0002 09/21/22 0042  NA 134* 137 138  K 4.2 3.7 3.9  CL 95* 105 107  CO2 24 24 24   GLUCOSE 88 97 93  BUN 14 12 11   CREATININE 1.48* 1.29* 1.20  CALCIUM 9.7 8.6* 8.7*  MG 2.3  --   --    Liver Function Tests: Recent Labs  Lab 09/19/22 1613 09/20/22 0002 09/21/22 0042  AST 43* 62* 73*  ALT 23 23 33  ALKPHOS 51 44 47  BILITOT 1.7* 1.6* 1.0  PROT 6.9 5.7* 5.6*  ALBUMIN 4.6 3.5 3.5   CBG: No results for input(s): "GLUCAP" in the last 168 hours.  Discharge time spent: greater than 30 minutes.  Signed: Meredeth Ide, MD [facility] 09/21/2022

## 2022-09-21 NOTE — Discharge Instructions (Signed)
Check with PCP, when you can go back to football practice

## 2022-09-21 NOTE — Progress Notes (Signed)
Pt to be d/c home to self care.  Reviewed AVS with pt and made him aware of f/u appointments with his PCP.  Answered any pending questions.  Pt had no further questions.  Removed PIV which was CDI and free from s/sx of infection upon removal.  Assisted pt in gathering his belongings and getting dressed.  Pt is ambulatory and walked off the unit to the front of the hospital where his ride awaits to take him home.  Pt d/c in stable condition.

## 2022-09-24 ENCOUNTER — Inpatient Hospital Stay: Payer: Medicaid Other | Admitting: Nurse Practitioner

## 2022-09-25 ENCOUNTER — Ambulatory Visit (INDEPENDENT_AMBULATORY_CARE_PROVIDER_SITE_OTHER): Payer: Medicaid Other | Admitting: Family Medicine

## 2022-09-25 VITALS — BP 128/84 | Ht 68.0 in | Wt 200.0 lb

## 2022-09-25 DIAGNOSIS — M6282 Rhabdomyolysis: Secondary | ICD-10-CM | POA: Diagnosis present

## 2022-09-25 NOTE — Progress Notes (Signed)
PCP: Ronney Asters, MD  Subjective:   HPI: Patient is a 22 y.o. male here for f/u rhabdomyolysis.  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 On 8/15 at football practice he began to cramp in legs the progressed to full body cramping. He was given fluids and placed in ice bath. Reported a single dark urine. He was then transported to the ED and found to have rhabdomyolysis. Has not had this before. In hospital for 2 days getting fluids and discharged on 8/17. Has not returned to physical activity Feels back to normal - no muscle cramping.  Past Medical History:  Diagnosis Date   Childhood obesity, BMI 95-100 percentile 12/20/2019    Current Outpatient Medications on File Prior to Visit  Medication Sig Dispense Refill   ASHWAGANDHA PO Take 1 tablet by mouth daily.     MACA ROOT PO Take 1 tablet by mouth every evening.     Misc Natural Products (BEET ROOT PO) Take 1 tablet by mouth daily.     MISC NATURAL PRODUCTS PO Take 1 capsule by mouth every evening. Seamoss     No current facility-administered medications on file prior to visit.    Past Surgical History:  Procedure Laterality Date   WISDOM TOOTH EXTRACTION      No Known Allergies  BP 128/84   Ht 5\' 8"  (1.727 m)   Wt 200 lb (90.7 kg)   BMI 30.41 kg/m       No data to display              No data to display              Objective:  Physical Exam:  Gen: NAD, comfortable in exam room.  No muscle rigidity.   Labs: CK - 8/15 1213; 8/16 3583; 8/17 3000 Mg normal Cr 8/15 1.48; 8/16 1.29; 8/17 1.20 AST 8/17 73 Prot 8/17 5.6 Ca 8/17 8.7 UA unavailable (pt reports they were just visually inspecting at hospital)  Assessment & Plan:  1. Rhabdomyolysis - first instance of this.  Reviewed notes and labwork from hospital stay.  Overall he is improved clinically.  Will repeat CK, CMP, UA today.  If labs back to normal and CK is < 1500 (5x upper reference range), he can resume light activities.   If still abnormal will repeat labs in 72+ hours.  Will need a week of light activities before he can start return to play protocol.  Encouraged fluids, out of practice in meantime.

## 2022-09-26 LAB — URINALYSIS
Bilirubin, UA: NEGATIVE
Glucose, UA: NEGATIVE
Ketones, UA: NEGATIVE
Leukocytes,UA: NEGATIVE
Nitrite, UA: NEGATIVE
Protein,UA: NEGATIVE
RBC, UA: NEGATIVE
Specific Gravity, UA: 1.009 (ref 1.005–1.030)
Urobilinogen, Ur: 0.2 mg/dL (ref 0.2–1.0)
pH, UA: 7.5 (ref 5.0–7.5)

## 2022-09-26 LAB — COMPREHENSIVE METABOLIC PANEL
ALT: 28 IU/L (ref 0–44)
AST: 23 IU/L (ref 0–40)
Albumin: 4.7 g/dL (ref 4.3–5.2)
Alkaline Phosphatase: 63 IU/L (ref 51–125)
BUN/Creatinine Ratio: 11 (ref 9–20)
BUN: 13 mg/dL (ref 6–20)
Bilirubin Total: 0.8 mg/dL (ref 0.0–1.2)
CO2: 27 mmol/L (ref 20–29)
Calcium: 10.1 mg/dL (ref 8.7–10.2)
Chloride: 99 mmol/L (ref 96–106)
Creatinine, Ser: 1.23 mg/dL (ref 0.76–1.27)
Globulin, Total: 2.3 g/dL (ref 1.5–4.5)
Glucose: 99 mg/dL (ref 70–99)
Potassium: 4.8 mmol/L (ref 3.5–5.2)
Sodium: 138 mmol/L (ref 134–144)
Total Protein: 7 g/dL (ref 6.0–8.5)
eGFR: 86 mL/min/{1.73_m2} (ref >59)

## 2022-09-26 LAB — CK: Total CK: 307 U/L (ref 49–439)

## 2022-11-15 ENCOUNTER — Ambulatory Visit: Payer: Medicaid Other | Admitting: Family Medicine

## 2023-05-12 ENCOUNTER — Emergency Department (HOSPITAL_COMMUNITY)
Admission: EM | Admit: 2023-05-12 | Discharge: 2023-05-12 | Disposition: A | Discharge status: Home / Self Care | Attending: Emergency Medicine | Admitting: Emergency Medicine

## 2023-05-12 ENCOUNTER — Emergency Department (HOSPITAL_COMMUNITY)

## 2023-05-12 DIAGNOSIS — R109 Unspecified abdominal pain: Secondary | ICD-10-CM

## 2023-05-12 DIAGNOSIS — R1011 Right upper quadrant pain: Secondary | ICD-10-CM | POA: Insufficient documentation

## 2023-05-12 LAB — COMPREHENSIVE METABOLIC PANEL WITH GFR
ALT: 23 U/L (ref 0–44)
AST: 31 U/L (ref 15–41)
Albumin: 4.4 g/dL (ref 3.5–5.0)
Alkaline Phosphatase: 55 U/L (ref 38–126)
Anion gap: 12 (ref 5–15)
BUN: 15 mg/dL (ref 6–20)
CO2: 23 mmol/L (ref 22–32)
Calcium: 9.7 mg/dL (ref 8.9–10.3)
Chloride: 103 mmol/L (ref 98–111)
Creatinine, Ser: 1.58 mg/dL — ABNORMAL HIGH (ref 0.61–1.24)
GFR, Estimated: >60 mL/min (ref >60)
Glucose, Bld: 88 mg/dL (ref 70–99)
Potassium: 4.5 mmol/L (ref 3.5–5.1)
Sodium: 138 mmol/L (ref 135–145)
Total Bilirubin: 1.3 mg/dL — ABNORMAL HIGH (ref 0.0–1.2)
Total Protein: 7.3 g/dL (ref 6.5–8.1)

## 2023-05-12 LAB — CBC
HCT: 46.3 % (ref 39.0–52.0)
Hemoglobin: 15.7 g/dL (ref 13.0–17.0)
MCH: 28.9 pg (ref 26.0–34.0)
MCHC: 33.9 g/dL (ref 30.0–36.0)
MCV: 85.1 fL (ref 80.0–100.0)
Platelets: 238 10*3/uL (ref 150–400)
RBC: 5.44 MIL/uL (ref 4.22–5.81)
RDW: 12.1 % (ref 11.5–15.5)
WBC: 6.5 10*3/uL (ref 4.0–10.5)
nRBC: 0 % (ref 0.0–0.2)

## 2023-05-12 LAB — CK: Total CK: 361 U/L (ref 49–397)

## 2023-05-12 LAB — LIPASE, BLOOD: Lipase: 24 U/L (ref 11–51)

## 2023-05-12 MED ORDER — NAPROXEN 250 MG PO TABS
500.0000 mg | ORAL_TABLET | Freq: Once | ORAL | Status: AC
  Administered 2023-05-12: 500 mg via ORAL
  Filled 2023-05-12: qty 2

## 2023-05-12 NOTE — Discharge Instructions (Addendum)
 Your testing shows that your kidneys are little bit on the dehydrated side, this will be fixed with lots of fluids.  Have your kidney tests rechecked within 1 month.  Your ultrasound was unremarkable, your blood work was otherwise unremarkable.  You can take Tylenol every 6 hours as needed.  Rest your abdominal muscles for the next couple of days, Zyrtec once a day for allergies

## 2023-05-12 NOTE — ED Provider Notes (Signed)
 [place] [facility] AT [facility] Provider Note   CSN: [id number] Arrival date & time: 05/12/23  1053     History  Chief Complaint  Patient presents with   Abdominal Pain    Jacob Decker is a 23 y.o. male.   Abdominal Pain  This patient is a 23 year old male, he denies any chronic medical conditions, in fact the patient states that he is otherwise healthy, takes no daily medicines and has never had any abdominal surgery.  He reports that yesterday he had come down with some symptoms including a runny nose, some sneezing and coughing, he thinks this is related to allergies and has not had any fever.  Those symptoms have continued today to some degree degree however he is noticing that he has had some right sided abdominal discomfort located in the right mid and upper abdomen.  This does seem to be worse when he engages the muscles of his abdomen.  He does not have any nausea vomiting or diarrhea, no changes in bowel habits and no urinary symptoms.  Again he has never had any abdominal surgery.  He denies any postprandial pain over the last couple of weeks and has no history of pancreatitis.  His last alcohol intake was a couple of weekends ago.  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  He just wanted to make sure thing was okay before he went back to practice.    Home Medications Prior to Admission medications   Medication Sig Start Date End Date Taking? Authorizing Provider  ASHWAGANDHA PO Take 1 tablet by mouth daily.    [provider]  MACA ROOT PO Take 1 tablet by mouth every evening.    [provider]  Misc Natural Products (BEET ROOT PO) Take 1 tablet by mouth daily.    [provider]  MISC NATURAL PRODUCTS PO Take 1 capsule by mouth every evening. Seamoss    [provider]      Allergies    Patient has no known allergies.    Review of Systems   Review of Systems  Gastrointestinal:   Positive for abdominal pain.  All other systems reviewed and are negative.   Physical Exam Updated Vital Signs BP (!) 143/96   Pulse 79   Temp 99.1 F (37.3 C)   Resp 19   Ht 1.727 m (5\' 8" )   Wt 92.5 kg   SpO2 99%   BMI 31.02 kg/m  Physical Exam Vitals and nursing note reviewed.  Constitutional:      General: He is not in acute distress.    Appearance: He is well-developed.  HENT:     Head: Normocephalic and atraumatic.     Mouth/Throat:     Pharynx: No oropharyngeal exudate.  Eyes:     General: No scleral icterus.       Right eye: No discharge.        Left eye: No discharge.     Conjunctiva/sclera: Conjunctivae normal.     Pupils: Pupils are equal, round, and reactive to light.  Neck:     Thyroid: No thyromegaly.     Vascular: No JVD.  Cardiovascular:     Rate and Rhythm: Normal rate and regular rhythm.     Heart sounds: Normal heart sounds. No murmur heard.    No friction rub. No gallop.  Pulmonary:     Effort: Pulmonary effort is normal. No respiratory distress.     Breath sounds:  Normal breath sounds. No wheezing or rales.  Abdominal:     General: Bowel sounds are normal. There is no distension.     Palpations: Abdomen is soft. There is no mass.     Tenderness: There is abdominal tenderness.     Comments: There is a positive Carnett's sign to the right mid abdomen.  When the patient is laying supine with a soft abdomen he has no reproducible tenderness to palpation, there is no Murphy sign and no pain McBurney's point, no guarding, no pulsating mass, no CVA tenderness  Musculoskeletal:        General: No tenderness. Normal range of motion.     Cervical back: Normal range of motion and neck supple.  Lymphadenopathy:     Cervical: No cervical adenopathy.  Skin:    General: Skin is warm and dry.     Findings: No erythema or rash.  Neurological:     Mental Status: He is alert.     Coordination: Coordination normal.  Psychiatric:        Behavior: Behavior  normal.     ED Results / Procedures / Treatments   Labs (all labs ordered are listed, but only abnormal results are displayed) Labs Reviewed  COMPREHENSIVE METABOLIC PANEL WITH GFR - Abnormal; Notable for the following components:      Result Value   Creatinine, Ser 1.58 (*)    Total Bilirubin 1.3 (*)    All other components within normal limits  LIPASE, BLOOD  CBC  CK    EKG None  Radiology US Abdomen Limited RUQ (LIVER/GB) Result Date: 05/12/2023 CLINICAL DATA:  Right upper quadrant pain EXAM: ULTRASOUND ABDOMEN LIMITED RIGHT UPPER QUADRANT COMPARISON:  None Available. FINDINGS: Gallbladder: Gallbladder is distended. No shadowing stones. No wall thickening or adjacent fluid. Common bile duct: Diameter: 3 mm Liver: No focal lesion identified. Within normal limits in parenchymal echogenicity. Portal vein is patent on color Doppler imaging with normal direction of blood flow towards the liver. Other: None. IMPRESSION: Distended gallbladder.  No shadowing stones.  No ductal dilatation. Electronically Signed   By: Karen Kays M.D.   On: 05/12/2023 14:35    Procedures Procedures    Medications Ordered in ED Medications  naproxen (NAPROSYN) tablet 500 mg (500 mg Oral Given 05/12/23 1239)    ED Course/ Medical Decision Making/ A&P                                 Medical Decision Making Amount and/or Complexity of Data Reviewed Labs: ordered. Radiology: ordered.  Risk Prescription drug management.    This patient presents to the ED for concern of abdominal pain differential diagnosis includes muscular wall strain or muscular strain or spasm, right upper quadrant and epigastrium are minimally tender, there is engageable tenderness with use of the right abdominal wall musculature.  Could be cholecystitis or pancreatitis, labs pending, this seems less likely.  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  The pertinent results include: CK around 350, otherwise labs normal   Imaging Studies ordered:  I ordered imaging studies including ultrasound of the right upper quadrant I independently visualized and interpreted imaging which showed distended gallbladder but no other findings of cholecystitis I agree with the radiologist interpretation   Medicines ordered and prescription drug management:  Naprosyn given, improved I have reviewed the patients home medicines and have made adjustments as needed   Problem List / ED Course:  Stable for discharge, patient updated on results, agreeable to return should symptoms worsen   Social Determinants of Health:  None         Final Clinical Impression(s) / ED Diagnoses Final diagnoses:  Right lateral abdominal pain    Rx / DC Orders ED Discharge Orders     None         Eber Hong, MD 05/12/23 1502

## 2023-05-12 NOTE — ED Notes (Signed)
 Patient transported to Ultrasound

## 2023-05-12 NOTE — ED Triage Notes (Signed)
 Pt c/o RUQ pain that started today. Denies N/V/D.

## 2023-10-28 ENCOUNTER — Encounter (HOSPITAL_COMMUNITY): Payer: Self-pay

## 2023-10-28 ENCOUNTER — Other Ambulatory Visit: Payer: Self-pay

## 2023-10-28 ENCOUNTER — Emergency Department (HOSPITAL_COMMUNITY)
Admission: EM | Admit: 2023-10-28 | Discharge: 2023-10-28 | Discharge status: Against Medical Advice | Attending: Emergency Medicine | Admitting: Emergency Medicine

## 2023-10-28 DIAGNOSIS — R252 Cramp and spasm: Secondary | ICD-10-CM | POA: Insufficient documentation

## 2023-10-28 DIAGNOSIS — Z5321 Procedure and treatment not carried out due to patient leaving prior to being seen by health care provider: Secondary | ICD-10-CM | POA: Insufficient documentation

## 2023-10-28 LAB — COMPREHENSIVE METABOLIC PANEL WITH GFR
ALT: 21 U/L (ref 0–44)
AST: 34 U/L (ref 15–41)
Albumin: 4.3 g/dL (ref 3.5–5.0)
Alkaline Phosphatase: 66 U/L (ref 38–126)
Anion gap: 12 (ref 5–15)
BUN: 13 mg/dL (ref 6–20)
CO2: 24 mmol/L (ref 22–32)
Calcium: 9.8 mg/dL (ref 8.9–10.3)
Chloride: 99 mmol/L (ref 98–111)
Creatinine, Ser: 1.62 mg/dL — ABNORMAL HIGH (ref 0.61–1.24)
GFR, Estimated: >60 mL/min (ref >60)
Glucose, Bld: 87 mg/dL (ref 70–99)
Potassium: 3.8 mmol/L (ref 3.5–5.1)
Sodium: 135 mmol/L (ref 135–145)
Total Bilirubin: 1.1 mg/dL (ref 0.0–1.2)
Total Protein: 7.1 g/dL (ref 6.5–8.1)

## 2023-10-28 LAB — URINALYSIS, ROUTINE W REFLEX MICROSCOPIC
Bilirubin Urine: NEGATIVE
Glucose, UA: NEGATIVE mg/dL
Hgb urine dipstick: NEGATIVE
Ketones, ur: NEGATIVE mg/dL
Leukocytes,Ua: NEGATIVE
Nitrite: NEGATIVE
Protein, ur: NEGATIVE mg/dL
Specific Gravity, Urine: 1.004 — ABNORMAL LOW (ref 1.005–1.030)
pH: 5 (ref 5.0–8.0)

## 2023-10-28 LAB — CBC WITH DIFFERENTIAL/PLATELET
Abs Immature Granulocytes: 0.09 K/uL — ABNORMAL HIGH (ref 0.00–0.07)
Basophils Absolute: 0.1 K/uL (ref 0.0–0.1)
Basophils Relative: 1 %
Eosinophils Absolute: 0 K/uL (ref 0.0–0.5)
Eosinophils Relative: 0 %
HCT: 43.8 % (ref 39.0–52.0)
Hemoglobin: 15 g/dL (ref 13.0–17.0)
Immature Granulocytes: 1 %
Lymphocytes Relative: 17 %
Lymphs Abs: 2.1 K/uL (ref 0.7–4.0)
MCH: 28.8 pg (ref 26.0–34.0)
MCHC: 34.2 g/dL (ref 30.0–36.0)
MCV: 84.1 fL (ref 80.0–100.0)
Monocytes Absolute: 0.7 K/uL (ref 0.1–1.0)
Monocytes Relative: 6 %
Neutro Abs: 9.1 K/uL — ABNORMAL HIGH (ref 1.7–7.7)
Neutrophils Relative %: 75 %
Platelets: 236 K/uL (ref 150–400)
RBC: 5.21 MIL/uL (ref 4.22–5.81)
RDW: 11.9 % (ref 11.5–15.5)
WBC: 12.1 K/uL — ABNORMAL HIGH (ref 4.0–10.5)
nRBC: 0 % (ref 0.0–0.2)

## 2023-10-28 LAB — CK: Total CK: 1056 U/L — ABNORMAL HIGH (ref 49–397)

## 2023-10-28 NOTE — ED Notes (Signed)
 Pt states he has to leave and cannot stay any longer. Pt advised to return for worsening symptoms.

## 2023-10-28 NOTE — ED Triage Notes (Signed)
 Pt in ambulatory with c/o severe leg cramps. 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 States urine clear, has had rhabdo in the past. States this feels similar

## 2023-10-28 NOTE — ED Provider Triage Note (Signed)
 Emergency Medicine Provider Triage Evaluation Note  Jacob Decker , a 23 y.o. male  was evaluated in triage.  Pt complains of leg cramping, improved.  Patient has a history of sickle cell trait and a history of rhabdomyolysis.  Patient reports doing squats this morning and running more than usual at football practice.  He had cramping in his thighs bilaterally.  Again, symptoms now improved and he denies pain.  Given his history, recommended he come to be checked.  Review of Systems  Positive: Leg cramping Negative: Urine change  Physical Exam  BP (!) 144/101 (BP Location: Right Arm)   Pulse 70   Temp 98.8 F (37.1 C)   Resp 20   Ht 5' 9 (1.753 m)   Wt 95.3 kg   SpO2 100%   BMI 31.01 kg/m  Gen:   Awake, no distress   Resp:  Normal effort  MSK:   Moves extremities without difficulty  Other:  Full range of motion of the lower extremities, compartments soft  Medical Decision Making  Medically screening exam initiated at 7:20 PM.  Appropriate orders placed.  Jacob Decker was informed that the remainder of the evaluation will be completed by another provider, this initial triage assessment does not replace that evaluation, and the importance of remaining in the ED until their evaluation is complete.     Eluterio, Seymour, PA-C 10/28/23 1922
